# Patient Record
Sex: Female | Born: 1985
Health system: Southern US, Community
[De-identification: ages and names within clinical notes are randomized; demographics above are authoritative.]

---

## 2013-11-10 ENCOUNTER — Encounter (HOSPITAL_COMMUNITY): Payer: Self-pay | Admitting: Emergency Medicine

## 2013-11-10 ENCOUNTER — Emergency Department (HOSPITAL_COMMUNITY)
Admission: EM | Admit: 2013-11-10 | Discharge: 2013-11-10 | Disposition: A | Discharge status: Home / Self Care | Payer: Worker's Compensation | Attending: Emergency Medicine | Admitting: Emergency Medicine

## 2013-11-10 ENCOUNTER — Emergency Department (HOSPITAL_COMMUNITY): Payer: Worker's Compensation

## 2013-11-10 DIAGNOSIS — S93409A Sprain of unspecified ligament of unspecified ankle, initial encounter: Secondary | ICD-10-CM | POA: Insufficient documentation

## 2013-11-10 DIAGNOSIS — Z79899 Other long term (current) drug therapy: Secondary | ICD-10-CM | POA: Insufficient documentation

## 2013-11-10 DIAGNOSIS — IMO0002 Reserved for concepts with insufficient information to code with codable children: Secondary | ICD-10-CM | POA: Insufficient documentation

## 2013-11-10 DIAGNOSIS — Y929 Unspecified place or not applicable: Secondary | ICD-10-CM | POA: Insufficient documentation

## 2013-11-10 DIAGNOSIS — R296 Repeated falls: Secondary | ICD-10-CM | POA: Insufficient documentation

## 2013-11-10 DIAGNOSIS — F17200 Nicotine dependence, unspecified, uncomplicated: Secondary | ICD-10-CM | POA: Insufficient documentation

## 2013-11-10 DIAGNOSIS — Y9389 Activity, other specified: Secondary | ICD-10-CM | POA: Insufficient documentation

## 2013-11-10 DIAGNOSIS — Y990 Civilian activity done for income or pay: Secondary | ICD-10-CM | POA: Insufficient documentation

## 2013-11-10 DIAGNOSIS — X500XXA Overexertion from strenuous movement or load, initial encounter: Secondary | ICD-10-CM | POA: Insufficient documentation

## 2013-11-10 DIAGNOSIS — Z880 Allergy status to penicillin: Secondary | ICD-10-CM | POA: Insufficient documentation

## 2013-11-10 MED ORDER — OXYCODONE-ACETAMINOPHEN 5-325 MG PO TABS: 1.0000 | ORAL_TABLET | Freq: Four times a day (QID) | ORAL | Status: AC | PRN

## 2013-11-10 MED ORDER — OXYCODONE-ACETAMINOPHEN 5-325 MG PO TABS
2.0000 | ORAL_TABLET | Freq: Once | ORAL | Status: AC
  Administered 2013-11-10: 2 via ORAL
  Filled 2013-11-10: qty 2

## 2013-11-10 NOTE — Discharge Instructions (Signed)
Acute Ankle Sprain  with Phase I Rehab  An acute ankle sprain is a partial or complete tear in one or more of the ligaments of the ankle due to traumatic injury. The severity of the injury depends on both the the number of ligaments sprained and the grade of sprain. There are 3 grades of sprains.   · A grade 1 sprain is a mild sprain. There is a slight pull without obvious tearing. There is no loss of strength, and the muscle and ligament are the correct length.  · A grade 2 sprain is a moderate sprain. There is tearing of fibers within the substance of the ligament where it connects two bones or two cartilages. The length of the ligament is increased, and there is usually decreased strength.  · A grade 3 sprain is a complete rupture of the ligament and is uncommon.  In addition to the grade of sprain, there are three types of ankle sprains.   Lateral ankle sprains: This is a sprain of one or more of the three ligaments on the outer side (lateral) of the ankle. These are the most common sprains.  Medial ankle sprains: There is one large triangular ligament of the inner side (medial) of the ankle that is susceptible to injury. Medial ankle sprains are less common.  Syndesmosis, "high ankle," sprains: The syndesmosis is the ligament that connects the two bones of the lower leg. Syndesmosis sprains usually only occur with very severe ankle sprains.  SYMPTOMS  · Pain, tenderness, and swelling in the ankle, starting at the side of injury that may progress to the whole ankle and foot with time.  · "Pop" or tearing sensation at the time of injury.  · Bruising that may spread to the heel.  · Impaired ability to walk soon after injury.  CAUSES   · Acute ankle sprains are caused by trauma placed on the ankle that temporarily forces or pries the anklebone (talus) out of its normal socket.  · Stretching or tearing of the ligaments that normally hold the joint in place (usually due to a twisting injury).  RISK INCREASES  WITH:  · Previous ankle sprain.  · Sports in which the foot may land awkwardly (ie. basketball, volleyball, or soccer) or walking or running on uneven or rough surfaces.  · Shoes with inadequate support to prevent sideways motion when stress occurs.  · Poor strength and flexibility.  · Poor balance skills.  · Contact sports.  PREVENTION   · Warm up and stretch properly before activity.  · Maintain physical fitness:  · Ankle and leg flexibility, muscle strength, and endurance.  · Cardiovascular fitness.  · Balance training activities.  · Use proper technique and have a coach correct improper technique.  · Taping, protective strapping, bracing, or high-top tennis shoes may help prevent injury. Initially, tape is best; however, it loses most of its support function within 10 to 15 minutes.  · Wear proper fitted protective shoes (High-top shoes with taping or bracing is more effective than either alone).  · Provide the ankle with support during sports and practice activities for 12 months following injury.  PROGNOSIS   · If treated properly, ankle sprains can be expected to recover completely; however, the length of recovery depends on the degree of injury.  · A grade 1 sprain usually heals enough in 5 to 7 days to allow modified activity and requires an average of 6 weeks to heal completely.  · A grade 2 sprain requires   symptoms may result in a chronic problem. Appropriately addressing the problem the first time decreases the frequency of recurrence and optimizes healing time. Severity of the initial sprain does not predict the likelihood of later instability.  Injury to other structures (bone, cartilage, or tendon).  A chronically unstable or arthritic ankle joint is a possiblity with repeated  sprains. TREATMENT Treatment initially involves the use of ice, medication, and compression bandages to help reduce pain and inflammation. Ankle sprains are usually immobilized in a walking cast or boot to allow for healing. Crutches may be recommended to reduce pressure on the injury. After immobilization, strengthening and stretching exercises may be necessary to regain strength and a full range of motion. Surgery is rarely needed to treat ankle sprains. MEDICATION   Nonsteroidal anti-inflammatory medications, such as aspirin and ibuprofen (do not take for the first 3 days after injury or within 7 days before surgery), or other minor pain relievers, such as acetaminophen, are often recommended. Take these as directed by your caregiver. Contact your caregiver immediately if any bleeding, stomach upset, or signs of an allergic reaction occur from these medications.  Ointments applied to the skin may be helpful.  Pain relievers may be prescribed as necessary by your caregiver. Do not take prescription pain medication for longer than 4 to 7 days. Use only as directed and only as much as you need. HEAT AND COLD  Cold treatment (icing) is used to relieve pain and reduce inflammation for acute and chronic cases. Cold should be applied for 10 to 15 minutes every 2 to 3 hours for inflammation and pain and immediately after any activity that aggravates your symptoms. Use ice packs or an ice massage.  Heat treatment may be used before performing stretching and strengthening activities prescribed by your caregiver. Use a heat pack or a warm soak. SEEK IMMEDIATE MEDICAL CARE IF:   Pain, swelling, or bruising worsens despite treatment.  You experience pain, numbness, discoloration, or coldness in the foot or toes.  New, unexplained symptoms develop (drugs used in treatment may produce side effects.) EXERCISES  PHASE I EXERCISES RANGE OF MOTION (ROM) AND STRETCHING EXERCISES - Ankle Sprain, Acute Phase I,  Weeks 1 to 2 These exercises may help you when beginning to restore flexibility in your ankle. You will likely work on these exercises for the 1 to 2 weeks after your injury. Once your physician, physical therapist, or athletic trainer sees adequate progress, he or she will advance your exercises. While completing these exercises, remember:   Restoring tissue flexibility helps normal motion to return to the joints. This allows healthier, less painful movement and activity.  An effective stretch should be held for at least 30 seconds.  A stretch should never be painful. You should only feel a gentle lengthening or release in the stretched tissue. RANGE OF MOTION - Dorsi/Plantar Flexion  While sitting with your right / left knee straight, draw the top of your foot upwards by flexing your ankle. Then reverse the motion, pointing your toes downward.  Hold each position for __________ seconds.  After completing your first set of exercises, repeat this exercise with your knee bent. Repeat __________ times. Complete this exercise __________ times per day.  RANGE OF MOTION - Ankle Alphabet  Imagine your right / left big toe is a pen.  Keeping your hip and knee still, write out the entire alphabet with your "pen." Make the letters as large as you can without increasing any discomfort. Repeat __________ times. Complete this  exercise __________ times per day.  STRENGTHENING EXERCISES - Ankle Sprain, Acute -Phase I, Weeks 1 to 2 These exercises may help you when beginning to restore strength in your ankle. You will likely work on these exercises for 1 to 2 weeks after your injury. Once your physician, physical therapist, or athletic trainer sees adequate progress, he or she will advance your exercises. While completing these exercises, remember:   Muscles can gain both the endurance and the strength needed for everyday activities through controlled exercises.  Complete these exercises as instructed by  your physician, physical therapist, or athletic trainer. Progress the resistance and repetitions only as guided.  You may experience muscle soreness or fatigue, but the pain or discomfort you are trying to eliminate should never worsen during these exercises. If this pain does worsen, stop and make certain you are following the directions exactly. If the pain is still present after adjustments, discontinue the exercise until you can discuss the trouble with your clinician. STRENGTH - Dorsiflexors  Secure a rubber exercise band/tubing to a fixed object (ie. table, pole) and loop the other end around your right / left foot.  Sit on the floor facing the fixed object. The band/tubing should be slightly tense when your foot is relaxed.  Slowly draw your foot back toward you using your ankle and toes.  Hold this position for __________ seconds. Slowly release the tension in the band and return your foot to the starting position. Repeat __________ times. Complete this exercise __________ times per day.  STRENGTH - Plantar-flexors   Sit with your right / left leg extended. Holding onto both ends of a rubber exercise band/tubing, loop it around the ball of your foot. Keep a slight tension in the band.  Slowly push your toes away from you, pointing them downward.  Hold this position for __________ seconds. Return slowly, controlling the tension in the band/tubing. Repeat __________ times. Complete this exercise __________ times per day.  STRENGTH - Ankle Eversion  Secure one end of a rubber exercise band/tubing to a fixed object (table, pole). Loop the other end around your foot just before your toes.  Place your fists between your knees. This will focus your strengthening at your ankle.  Drawing the band/tubing across your opposite foot, slowly, pull your little toe out and up. Make sure the band/tubing is positioned to resist the entire motion.  Hold this position for __________ seconds. Have  your muscles resist the band/tubing as it slowly pulls your foot back to the starting position.  Repeat __________ times. Complete this exercise __________ times per day.  STRENGTH - Ankle Inversion  Secure one end of a rubber exercise band/tubing to a fixed object (table, pole). Loop the other end around your foot just before your toes.  Place your fists between your knees. This will focus your strengthening at your ankle.  Slowly, pull your big toe up and in, making sure the band/tubing is positioned to resist the entire motion.  Hold this position for __________ seconds.  Have your muscles resist the band/tubing as it slowly pulls your foot back to the starting position. Repeat __________ times. Complete this exercises __________ times per day.  STRENGTH - Towel Curls  Sit in a chair positioned on a non-carpeted surface.  Place your right / left foot on a towel, keeping your heel on the floor.  Pull the towel toward your heel by only curling your toes. Keep your heel on the floor.  If instructed by your physician,  physical therapist, or athletic trainer, add weight to the end of the towel. Repeat __________ times. Complete this exercise __________ times per day. Document Released: 02/25/2005 Document Revised: 10/19/2011 Document Reviewed: 11/08/2008 [facility] PaExit[facility] Patient Information 2014 [place]Care, [place]. Start the exercises in 1 week

## 2013-11-10 NOTE — ED Notes (Signed)
Pt states she was at work carrying a large box, she fell into an uncovered water drain in the parking lot and twisted her ankle.

## 2013-11-10 NOTE — ED Provider Notes (Signed)
CSN: [id number]     Arrival date & time 11/10/13  2010 History  This chart was scribed for non-physician practitioner, Arman FilterGail K Keashia Haskins, NP, working with Shanna CiscoMegan E Docherty, MD by Charline BillsEssence Howell, ED Scribe. This patient was seen in room WTR7/WTR7 and the patient's care was started at 8:47 PM.   Chief Complaint  Patient presents with  . Ankle Pain    The history is provided by the patient. No language interpreter was used.   HPI Comments: Sonia HousemanChelsea Tiernan is a 28 y.o. female who presents to the Emergency Department complaining of L ankle injury onset 1 hr ago. Pt states that she was carrying a large box at work when she fell into an uncovered drain and twisted her ankle. She describes the pain in her ankle as tingling. She denies any other pain.  History reviewed. No pertinent past medical history. History reviewed. No pertinent past surgical history. History reviewed. No pertinent family history. History  Substance Use Topics  . Smoking status: Current Every Day Smoker    Types: Cigarettes  . Smokeless tobacco: Not on file  . Alcohol Use: No   OB History   Grav Para Term Preterm Abortions TAB SAB Ect Mult Living                 Review of Systems  Musculoskeletal: Positive for arthralgias.     Allergies  Penicillins  Home Medications   Current Outpatient Rx  Name  Route  Sig  Dispense  Refill  . fluticasone (FLONASE) 50 MCG/ACT nasal spray   Each Nare   Place 1 spray into both nostrils daily.         Marland Kitchen. loratadine-pseudoephedrine (CLARITIN-D 24-HOUR) 10-240 MG per 24 hr tablet   Oral   Take 1 tablet by mouth daily.         Marland Kitchen. oxyCODONE-acetaminophen (PERCOCET/ROXICET) 5-325 MG per tablet   Oral   Take 1 tablet by mouth every 6 (six) hours as needed for severe pain.   14 tablet   0    Triage Vitals: Pulse 88  Temp(Src) 98.2 F (36.8 C)  Resp 18  SpO2 98% Physical Exam  ED Course  Procedures (including critical care time) DIAGNOSTIC STUDIES: Oxygen Saturation  is 98% on RA, normal by my interpretation.    COORDINATION OF CARE: 8:49 PM-Discussed treatment plan which includes L ankle XR with pt at bedside and pt agreed to plan.   Labs Review Labs Reviewed - No data to display Imaging Review Dg Ankle Complete Left  11/10/2013   CLINICAL DATA:  Left ankle pain post twisted ankle  EXAM: LEFT ANKLE COMPLETE - 3+ VIEW  COMPARISON:  None.  FINDINGS: Three views of left ankle submitted. No acute fracture or subluxation. Ankle mortise is preserved. Soft tissue swelling adjacent to lateral malleolus.  IMPRESSION: No acute fracture or subluxation.  Lateral soft tissue swelling.   Electronically Signed   By: Natasha MeadLiviu  Pop M.D.   On: 11/10/2013 20:40     EKG Interpretation None      MDM  Patient takes reviewed.  No fracture noted.  She's been placed in an ASO, taken out of work until Tuesday, Percocet for discomfort.  Patient is to keep foot elevated as much as possible over the next several days with ice applied.  She's been given referral to orthopedics for followup Final diagnoses:  Ankle sprain      I personally performed the services described in this documentation, which was scribed in my presence.  The recorded information has been reviewed and is accurate.     Arman Filter, NP 11/10/13 2134

## 2013-11-11 NOTE — ED Provider Notes (Signed)
Medical screening examination/treatment/procedure(s) were performed by non-physician practitioner and as supervising physician I was immediately available for consultation/collaboration.   Megan E Docherty, MD 11/11/13 1038 

## 2015-02-23 ENCOUNTER — Encounter (HOSPITAL_COMMUNITY): Payer: Self-pay | Admitting: Emergency Medicine

## 2015-02-23 ENCOUNTER — Emergency Department (HOSPITAL_COMMUNITY)
Admission: EM | Admit: 2015-02-23 | Discharge: 2015-02-23 | Disposition: A | Discharge status: Home / Self Care | Payer: 59 | Source: Home / Self Care | Attending: Emergency Medicine | Admitting: Emergency Medicine

## 2015-02-23 DIAGNOSIS — S61219A Laceration without foreign body of unspecified finger without damage to nail, initial encounter: Secondary | ICD-10-CM

## 2015-02-23 NOTE — ED Notes (Signed)
Pt reports she cut her left index finger around 1750 while cutting a cake Bleeding controlled... Last tetanus >1 year  Alert, no signs of acute distress.

## 2015-02-23 NOTE — Discharge Instructions (Signed)
Laceration Care, Adult A laceration is a cut or lesion that goes through all layers of the skin and into the tissue just beneath the skin. TREATMENT  Some lacerations may not require closure. Some lacerations may not be able to be closed due to an increased risk of infection. It is important to see your caregiver as soon as possible after an injury to minimize the risk of infection and maximize the opportunity for successful closure. If closure is appropriate, pain medicines may be given, if needed. The wound will be cleaned to help prevent infection. Your caregiver will use stitches (sutures), staples, wound glue (adhesive), or skin adhesive strips to repair the laceration. These tools bring the skin edges together to allow for faster healing and a better cosmetic outcome. However, all wounds will heal with a scar. Once the wound has healed, scarring can be minimized by covering the wound with sunscreen during the day for 1 full year. HOME CARE INSTRUCTIONS  For skin adhesive strips:  Keep the wound clean and dry.  Do not get the skin adhesive strips wet. You may bathe carefully, using caution to keep the wound dry.  If the wound gets wet, pat it dry with a clean towel.  Skin adhesive strips will fall off on their own. You may trim the strips as the wound heals. Do not remove skin adhesive strips that are still stuck to the wound. They will fall off in time. You may need a tetanus shot if:  You cannot remember when you had your last tetanus shot.  You have never had a tetanus shot. If you get a tetanus shot, your arm may swell, get red, and feel warm to the touch. This is common and not a problem. If you need a tetanus shot and you choose not to have one, there is a rare chance of getting tetanus. Sickness from tetanus can be serious. SEEK MEDICAL CARE IF:   You have redness, swelling, or increasing pain in the wound.  You see a red line that goes away from the wound.  You have  yellowish-white fluid (pus) coming from the wound.  You have a fever.  You notice a bad smell coming from the wound or dressing.  Your wound breaks open before or after sutures have been removed.  You notice something coming out of the wound such as wood or glass.  Your wound is on your hand or foot and you cannot move a finger or toe. SEEK IMMEDIATE MEDICAL CARE IF:   Your pain is not controlled with prescribed medicine.  You have severe swelling around the wound causing pain and numbness or a change in color in your arm, hand, leg, or foot.  Your wound splits open and starts bleeding.  You have worsening numbness, weakness, or loss of function of any joint around or beyond the wound.  You develop painful lumps near the wound or on the skin anywhere on your body. MAKE SURE YOU:   Understand these instructions.  Will watch your condition.  Will get help right away if you are not doing well or get worse. Document Released: 07/27/2005 Document Revised: 10/19/2011 Document Reviewed: 01/20/2011 [facility] WorthExitCare Patient Information 2015 [place]ExitCare, [place]LL[place]. This information is not intended to replace advice given to you by your health care provider. Make sure you discuss any questions you have with your health care provider.  Keep the brace on for the next 3 days.

## 2015-02-23 NOTE — ED Provider Notes (Signed)
CSN: [id number]     Arrival date & time 02/23/15  1843 History   First MD Initiated Contact with Patient 02/23/15 1913     Chief Complaint  Patient presents with  . Extremity Laceration   (Consider location/radiation/quality/duration/timing/severity/associated sxs/prior Treatment) HPI  She is a 29 year old woman here for evaluation of left index finger laceration. She states she was using a wedding cake knife to cut some flowers and it cut her index finger. She washed it and came here. Last tetanus was a year and a half ago.  She can fully extend and flex her finger.  History reviewed. No pertinent past medical history. History reviewed. No pertinent past surgical history. No family history on file. History  Substance Use Topics  . Smoking status: Current Every Day Smoker    Types: Cigarettes  . Smokeless tobacco: Not on file  . Alcohol Use: No   OB History    No data available     Review of Systems As in history of present illness Allergies  Penicillins  Home Medications   Prior to Admission medications   Medication Sig Start Date End Date Taking? Authorizing Provider  fluticasone (FLONASE) 50 MCG/ACT nasal spray Place 1 spray into both nostrils daily.    Historical Provider, MD  loratadine-pseudoephedrine (CLARITIN-D 24-HOUR) 10-240 MG per 24 hr tablet Take 1 tablet by mouth daily.    Historical Provider, MD  oxyCODONE-acetaminophen (PERCOCET/ROXICET) 5-325 MG per tablet Take 1 tablet by mouth every 6 (six) hours as needed for severe pain. 11/10/13   Earley FavorGail Schulz, NP   BP 119/79 mmHg  Pulse 63  Temp(Src) 97 F (36.1 C) (Oral)  Resp 20  SpO2 96%  LMP 01/29/2015 Physical Exam  Constitutional: She is oriented to person, place, and time. She appears well-developed and well-nourished. No distress.  Cardiovascular: Normal rate.   Pulmonary/Chest: Effort normal.  Neurological: She is alert and oriented to person, place, and time.  Skin:  Left index finger: She has a flap  type laceration over the middle phalanx. She can fully extend and flex her finger without difficulty. Extensor tendon is visualized, but does not have any laceration.    ED Course  LACERATION REPAIR Date/Time: 02/23/2015 7:47 PM Performed by: Charm RingsHONIG, Rashmi Tallent J Authorized by: Charm RingsHONIG, Savier Trickett J Consent: Verbal consent obtained. Risks and benefits: risks, benefits and alternatives were discussed Consent given by: patient Patient understanding: patient states understanding of the procedure being performed Patient identity confirmed: verbally with patient Time out: Immediately prior to procedure a "time out" was called to verify the correct patient, procedure, equipment, support staff and site/side marked as required. Body area: upper extremity Location details: left index finger Laceration length: 2 cm Irrigation solution: tap water Irrigation method: syringe Amount of cleaning: standard Wound skin closure material used: steri strips. Approximation: close Approximation difficulty: simple Dressing: antibiotic ointment and 4x4 sterile gauze Patient tolerance: Patient tolerated the procedure well with no immediate complications Comments: Finger brace applied.   (including critical care time) Labs Review Labs Reviewed - No data to display  Imaging Review No results found.   MDM   1. Finger laceration, initial encounter    Wound care as in after visit summary. Follow-up as needed.    Charm RingsErin J Erine Phenix, MD 02/23/15 [phone]

## 2015-11-12 IMAGING — CR DG ANKLE COMPLETE 3+V*L*
3 series · 3 of 3 positions shown · non-contrast
Comparison: None.

CLINICAL DATA: Left ankle pain post twisted ankle

EXAM:
LEFT ANKLE COMPLETE - 3+ VIEW

[x ankle ap left]
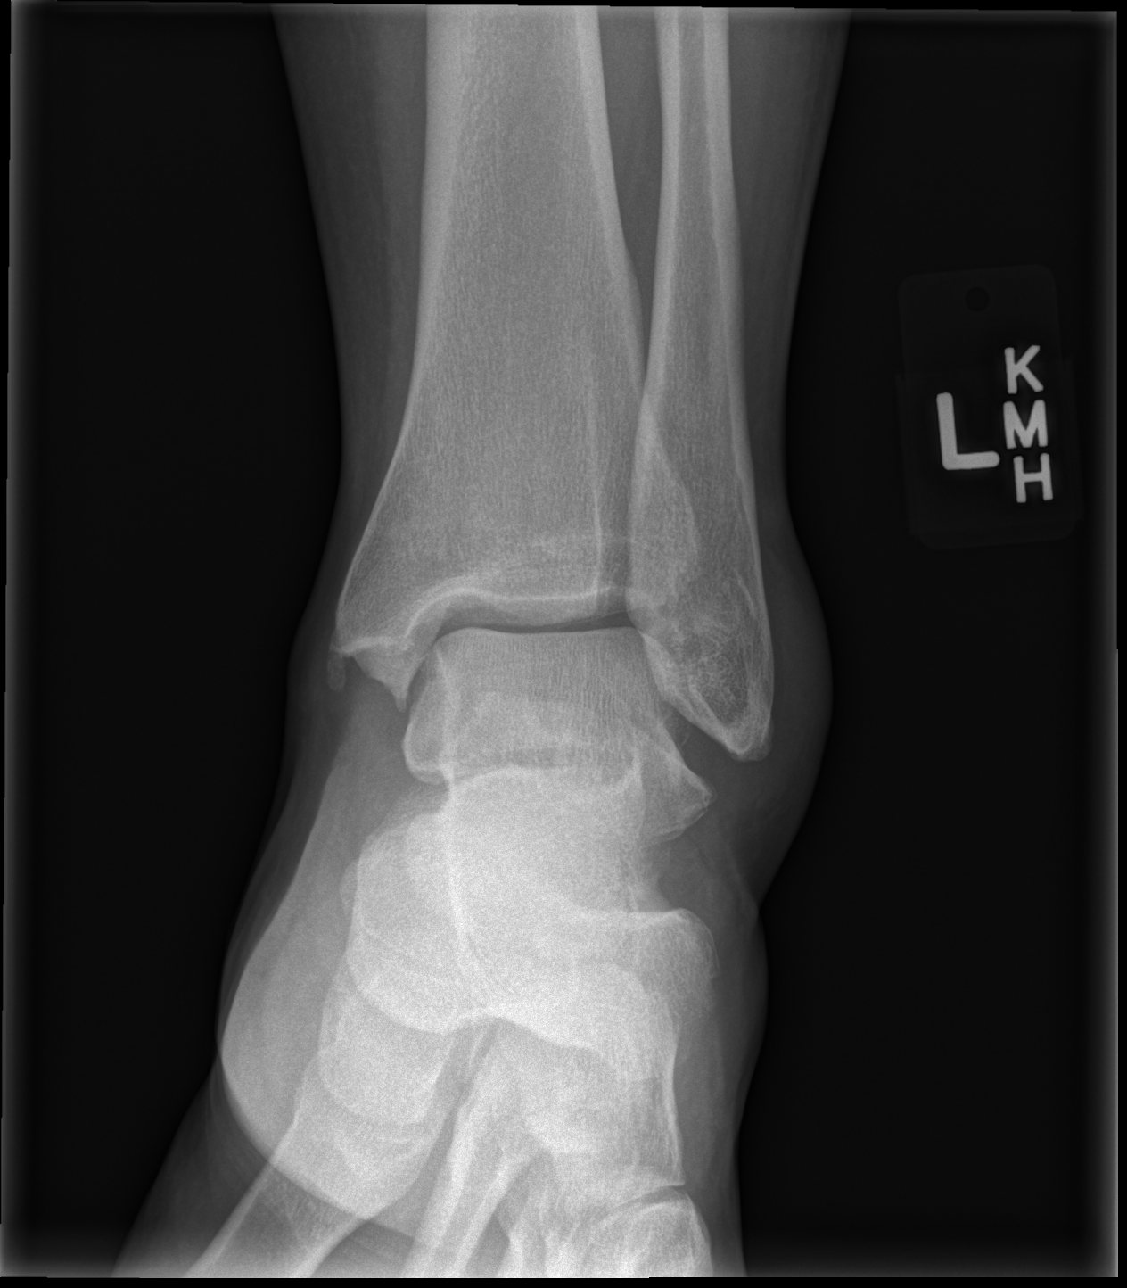

[x ankle obl left]
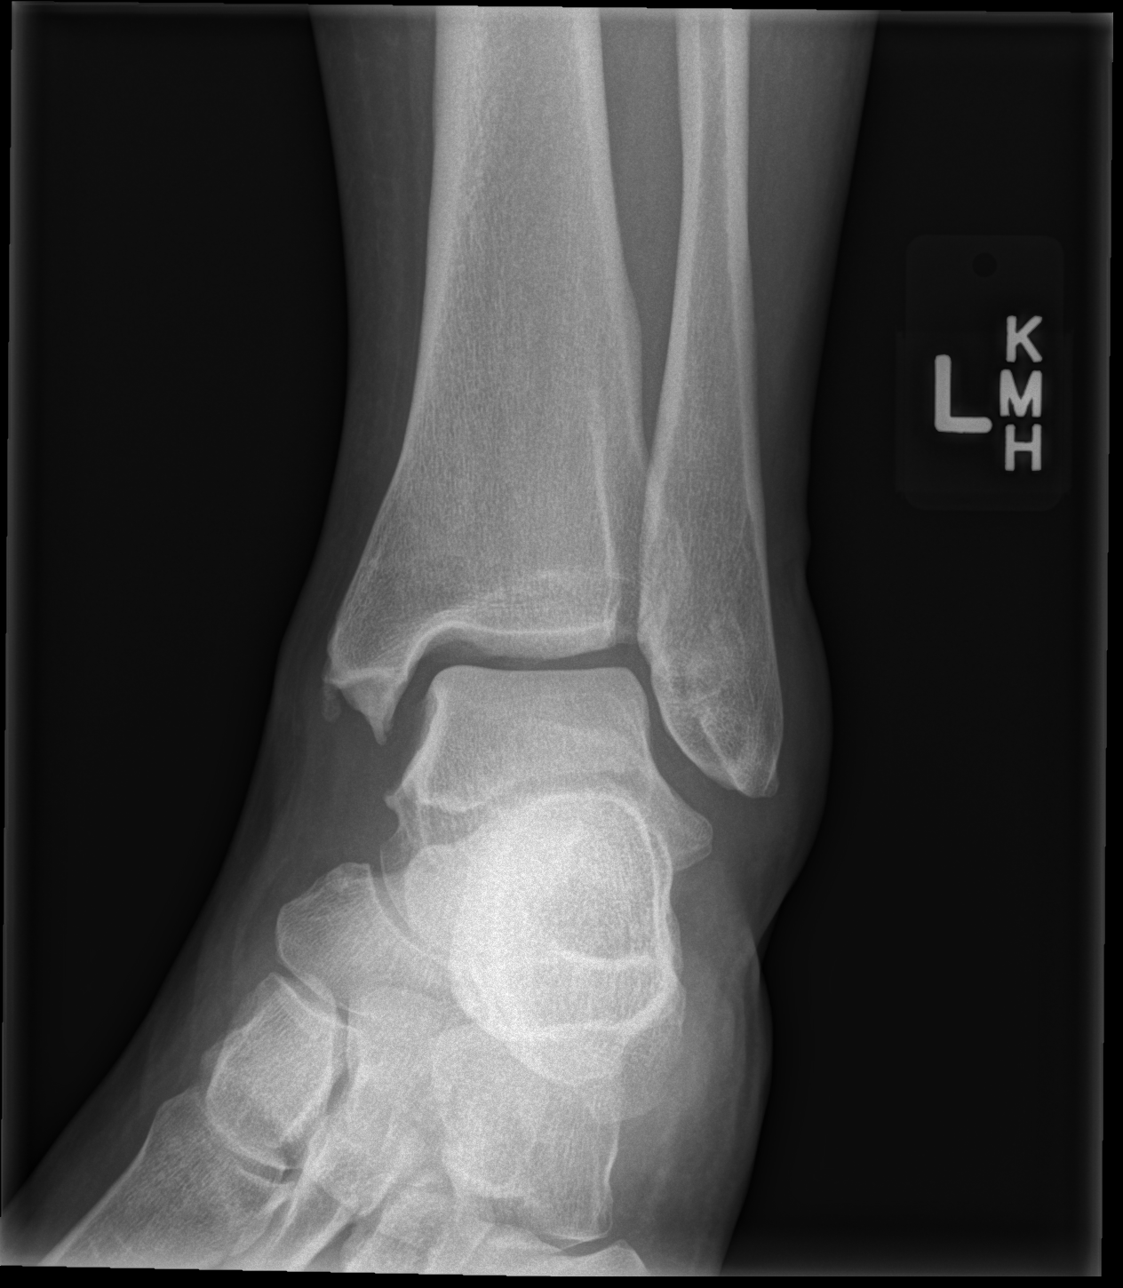

[x ankle lat left]
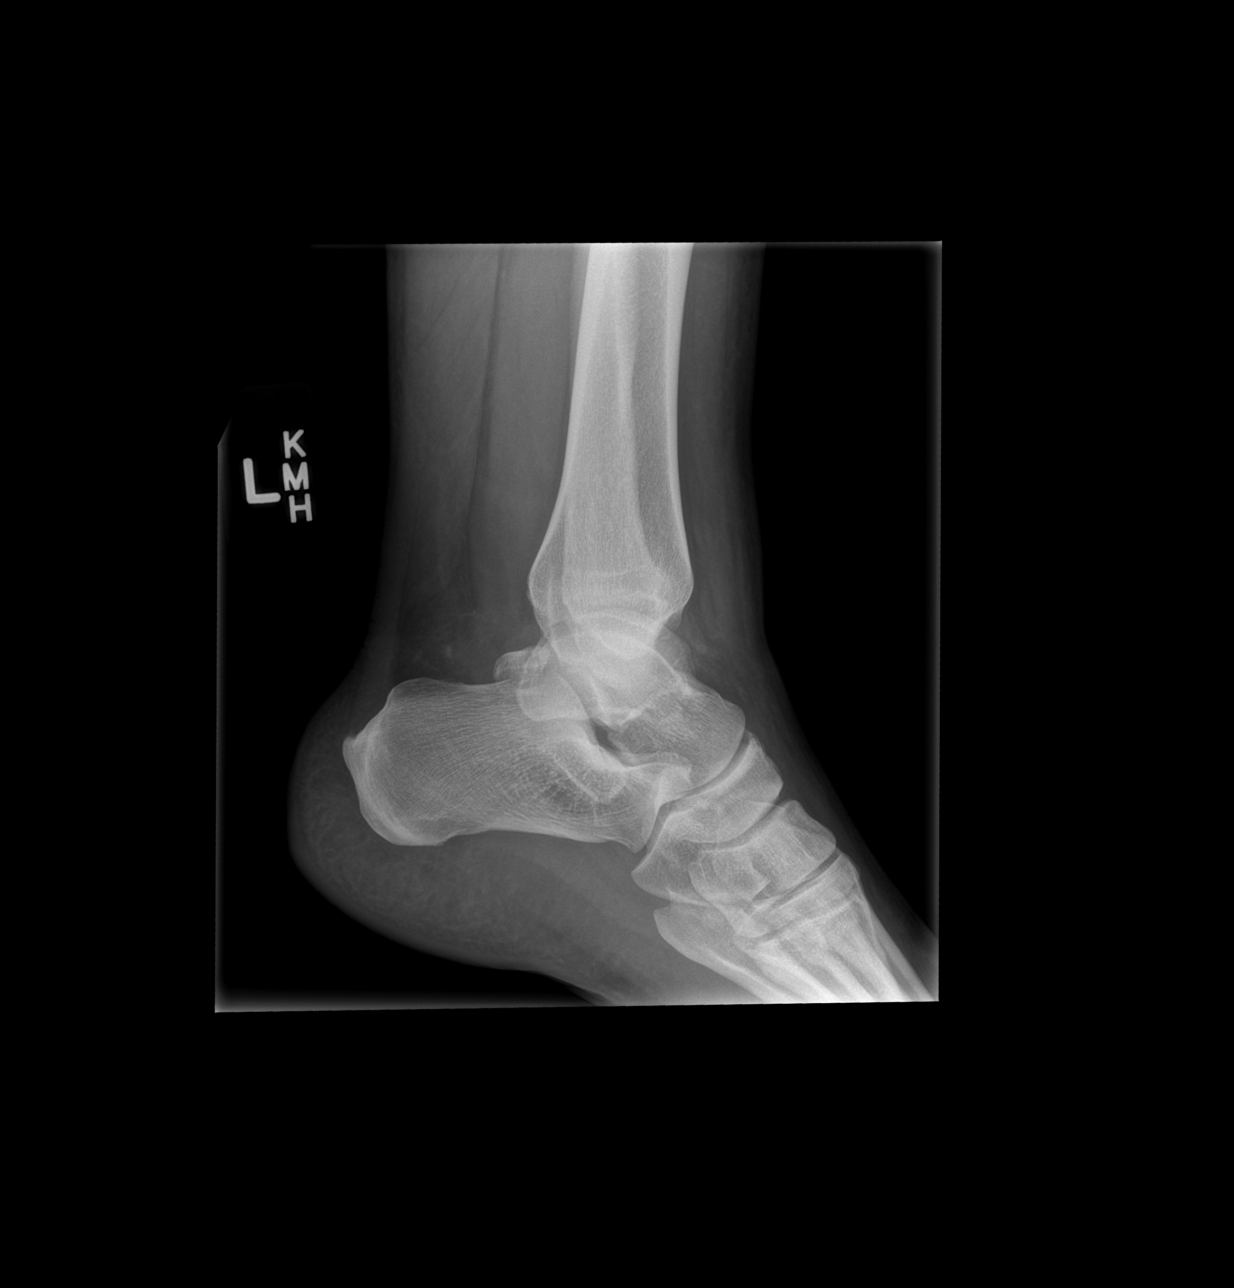

[3 of 3 positions shown; findings below may reference images not displayed]

FINDINGS: Three views of left ankle submitted. No acute fracture or
subluxation. Ankle mortise is preserved. Soft tissue swelling
adjacent to lateral malleolus.
IMPRESSION: No acute fracture or subluxation.  Lateral soft tissue swelling.

## 2024-01-24 NOTE — Progress Notes (Signed)
 Otolaryngology Office Visit Note  Seen at the kind request of No ref. provider found for evaluation of: Throat  Assessment and Plan Problem List Items Addressed This Visit   None Visit Diagnoses       Peritonsillar abscess    -  Primary      Surgery to Schedule  HPI Chief Complaint  Patient presents with  . Enlarged Tonsils    Tonsil abscess, trouble swallowing, pain, ear pain    Allergies Allergies[1]  Current Meds Current Medications[2]  Active Problems Problem List[3]  PSH Surgical History[4]  Family Hx Family History[5]  Social Hx Social History   Socioeconomic History  . Marital status: Single    Spouse name: Not on file  . Number of children: Not on file  . Years of education: Not on file  . Highest education level: Not on file  Occupational History  . Not on file  Tobacco Use  . Smoking status: Former    Current packs/day: 0.00    Types: Cigarettes    Quit date: 06/01/2014    Years since quitting: 9.6  . Smokeless tobacco: Never  . Tobacco comments:    stopped 05/30/2014  Substance and Sexual Activity  . Alcohol use: No    Comment: socially  . Drug use: No  . Sexual activity: Not on file  Other Topics Concern  . Not on file  Social History Narrative  . Not on file   Social Drivers of Health   Food Insecurity: Low Risk  (11/17/2023)   Food vital sign   . Within the past 12 months, you worried that your food would run out before you got money to buy more: Never true   . Within the past 12 months, the food you bought just didn't last and you didn't have money to get more: Never true  Transportation Needs: Unmet Transportation Needs (11/17/2023)   Transportation   . In the past 12 months, has lack of reliable transportation kept you from medical appointments, meetings, work or from getting things needed for daily living? : Yes  Safety: Low Risk  (01/11/2024)   Safety   . How often does anyone, including family and friends, physically hurt  you?: Never   . How often does anyone, including family and friends, insult or talk down to you?: Never   . How often does anyone, including family and friends, threaten you with harm?: Never   . How often does anyone, including family and friends, scream or curse at you?: Never  Living Situation: Low Risk  (11/17/2023)   Living Situation   . What is your living situation today?: I have a steady place to live   . Think about the place you live. Do you have problems with any of the following? Choose all that apply:: None/None on this list    Vital Signs Vitals:   01/24/24 0814  BP: 106/74  Pulse: 80    Physical Exam   EXAMINATION  Constitutional General Appearance: Well appearing in no acute distress Ability to Communicate: Age appropriate Pain: None at this time  Head & Face Inspection:  Normocephalic  Eyes Ocular Motility: Extraocular muscles intact   Ears Right EXT: Normal Right EAC: Normal Right TM: Normal Tuning Fork:  Left EXT: Normal Left ZJR:Wnmfjo Left TM: Normal Tuning Fork:      Nose Ext Nose: Dorsum midline Intranasal: No polyps or purulence  Oral Cavity, Mouth  Pharynx, Larynx See Assessment and Plan  Cervical Neck: No palpable masses  Thyroid: No palpable masses  Lymphatic Neck Nodes: No palpable nodes   Respiratory/Cardiac      Neuro/Psych Cranial Nerves:  Orientation: Age appropriate Mood & Affect: Age appropriate  GI/GU   Skin No facial lesions  Musculoskeletal   Extremities    PROCEDURE: Incision and drainage  Date/Time: 01/24/2024 8:40 AM  Performed by: Alm JULIANNA Georgina Mickey., MD Authorized by: Alm JULIANNA Georgina Mickey., MD   Location:    Type:  Abscess   Location:  Mouth   Mouth location:  Peritonsillar (Right) Procedure details:    Needle aspiration: yes     Needle size:  18 G   Incision types:  Single straight   Incision depth:  Submucosal   Wound management:  Probed and deloculated   Drainage:  Purulent   Drainage amount:  Moderate   Wound  treatment:  Wound left open Post-procedure details:    Procedure completion:  Tolerated   Alm Georgina, MD [phone]      [1] Allergies Allergen Reactions  . Latex Anaphylaxis  . Penicillin G Anaphylaxis  . Adhesive Other (See Comments)     RED WELPS, AND  Peels skin off, paper tape does work best  . Celecoxib GI Intolerance  [2]  Current Outpatient Medications:  .  acetaminophen  (TYLENOL ) 500 mg tablet, Take 1,500 mg by mouth every 6 (six) hours as needed., Disp: , Rfl:  .  ascorbic acid (VITAMIN C) 500 mg tablet, Take 500 mg by mouth daily., Disp: , Rfl:  .  cholecalciferol (VITAMIN D3) 1,250 mcg (50,000 unit) capsule, Take 1 each (50,000 Units total) by mouth daily., Disp: 90 capsule, Rfl: 1 .  clindamycin (CLEOCIN) 300 mg capsule, Take 2 capsules (600 mg total) by mouth 3 (three) times a day for 10 days., Disp: , Rfl:  .  ferrous sulfate 325 mg (65 mg iron) tablet, Take 325 mg by mouth in the morning and 325 mg at noon and 325 mg in the evening. Take before meals., Disp: , Rfl:  .  magnesium glycinate (Mag Glycinate) 100 mg tab, Take 3 tablets (300 mg total) by mouth daily., Disp: , Rfl:  .  multivitamin-min-iron-folic acid-vit K (Bariatric Multivitamins) 45 mg iron- 800 mcg-120 mcg cap, 2 (two) times a day., Disp: , Rfl:  .  ondansetron (ZOFRAN-ODT) 8 mg disintegrating tablet, Take 8 mg by mouth every 12 (twelve) hours as needed for nausea., Disp: 30 tablet, Rfl: 0 .  vit A palmitate-beta carotene 25,000 unit (15K-10K unit) tab, Take 1 tablet by mouth daily., Disp: , Rfl:  .  gabapentin (NEURONTIN) 400 mg capsule, Take 800 mg by mouth as needed. (Patient not taking: Reported on 11/30/2023), Disp: , Rfl:  [3] Patient Active Problem List Diagnosis  . Morbid obesity (HCC)  . Gastroesophageal reflux disease without esophagitis  . History of Roux-en-Y gastric bypass  . H/O bariatric surgery  . Mixed hyperlipidemia  . Essential hypertension  . SSS (sick sinus syndrome) (HCC)   . Pacemaker reprogramming/check  . Adjustment insomnia  . Vitamin D deficiency  . Tonsillitis  . Acute non-recurrent sinusitis  . Bile-induced gastritis  . Other fatigue  . Biliary sludge  . Chronic cholecystitis  . Postoperative examination  . Close exposure to COVID-19 virus  . Swelling of both lower extremities  . Swelling of left upper extremity  . Suspected COVID-19 virus infection  . Iron deficiency anemia  . Vitamin A deficiency  . Dehydration  . Bilateral hip pain  . Malabsorption syndrome (CMD)  .  Intramural and submucous leiomyoma of uterus  . Menorrhagia with regular cycle  . H/O total hysterectomy  . Postoperative fever  . Dysuria  . Abscess of tonsil  . Uvular edema  [4] Past Surgical History: Procedure Laterality Date  . BELT ABDOMINOPLASTY     Procedure: ABDOMINOPLASTY  . BRACHIOPLASTY     Procedure: BRACHIOPLASTY  . BREAST AUGMENTATION Bilateral    Procedure: AUGMENTATION MAMMAPLASTY  . CARDIAC CATHETERIZATION     Procedure: CARDIAC CATHETERIZATION  . DUODENAL SWITCH N/A 07/12/2017   Procedure: DUODENAL SWITCH LAPAROSCOPIC;  Surgeon: Wilfred Enzo Guan, MD;  Location: [facility] MAIN OR;  Service: General;  Laterality: N/A;  . ESOPHAGOGASTRODUODENOSCOPY     Procedure: ESOPHAGOGASTRODUODENOSCOPY  . INSERT / REPLACE / REMOVE PACEMAKER     Procedure: INSERT / REPLACE / REMOVE PACEMAKER  . LAPAROSCOPIC CHOLECYSTECTOMY N/A 2020   Procedure: LAPAROSCOPIC CHOLECYSTECTOMY  . LAPAROSCOPIC HYSTERECTOMY    . PILONIDAL CYST DRAINAGE  2006   Procedure: PILONIDAL CYST DRAINAGE  [5] Family History Problem Relation Name Age of Onset  . Heart disease Father         MI x 5, earliest MI at 87  . Hypertension Father    . Diabetes Father    . Kidney failure Paternal Grandmother    . Heart disease Paternal Grandmother    . Diabetes Paternal Grandmother    . Diabetes Paternal Aunt    . Diabetes Paternal Grandfather    . Breast cancer Neg Hx    . Colon cancer Neg  Hx

## 2024-01-26 NOTE — Progress Notes (Signed)
 Otolaryngology Patient Follow Up Note  Subjective: Ms. Sonia Holt is a 38 y.o. female who presented on 01/19/2024 with a right peritonsillar abscess.  This was treated with aspiration and clindamycin.  The patient Sonia Holt presented 5 days later with worsening pain and trismus.  Recurrence of the abscess was noted on exam and the patient was treated with formal I&D.    She returns today reporting persistent right-sided throat pain and trismus.  Symptoms are not significantly improved from previous.  No difficulty breathing or increased work of breathing.  No history of previous peritonsillar abscess.  Medical History[1] Surgical History[2] Family History[3]   Allergies[4]   ROS A complete review of systems was conducted and was negative except as stated in the HPI.   Objective: Vitals:   01/26/24 1339  BP: 114/79  Pulse: 80  Temp: 98.2 F (36.8 C)   Physical Exam:   General Normocephalic, Awake, Alert and appropriate for the exam  Eyes PERRL, no scleral icterus or conjunctival hemorrhage.  EOMI.  Ears Right ear- Normal auricles and EACs. TM: intact, no effusion, no retraction, normal landmarks. Left ear- Normal auricles and EACs. TM: intact, no effusion, no retraction, normal landmarks  Nose No external nasal deformities. Septum straight. Nasal mucosa moist. No significant turbinate hypertrophy. No masses or polyps.  Oral Cavity/Pharynx Tonsils: 2+, slight asymmetry of the right tonsil.  No significant asymmetry of the soft palate.  Uvula midline.  Palate elevates symmetrically.  Mild pharyngeal and tonsillar erythema.  No exudate. Mucosa moist and clear. No masses, lesions, or ulceration noted on the lips, alveolar ridges, buccal mucosa, floor of mouth, tongue, soft/hard palate, tonsils, or posterior pharynx. Soft FOM. Mobile tongue.  Neck/Lymphatics Soft and supple without LAD. Thyroid normal to palpation without dominant nodule or irregularity. Palpation of the salivary  glands demonstrates no masses or irregularity.  Cardio-vascular No cyanosis, regular rate  Pulmonary No audible stridor, Breathing easily with no labor. No dysphonia.   Neuro Symmetric facial movement.   Tongue protrudes in midline.  Psychiatry Appropriate affect and mood for clinic visit.  Skin No scars or lesions on face or neck.       Assessment:  Sonia Holt presents with evidence of resolving right peritonsillar abscess.  No clear evidence of recurrence on exam.  Recommended interrogation of previous I&D site, however the patient would like to defer at this time.  Will treat with combination of Levaquin and Flagyl as well as a prednisone taper.  Plan:  As above.  Return to clinic as needed for persistent or worsening symptoms.  Sonia MICAEL Rily, MD Otolaryngology - Head & Neck Surgery ENT & Audiology - [facility]   Electronically signed by: Sonia Elsie Rily, MD 01/26/2024 1:57 PM       [1] Past Medical History: Diagnosis Date  . Arthritis   . Bradycardia 03/22/2018  . Dental crown present    UPPER LEFT  . Depression   . Fatty liver   . Gastroesophageal reflux disease without esophagitis 06/18/2016  . H/O bariatric surgery 03/22/2018  . History of Roux-en-Y gastric bypass 07/29/2017  . Hypercholesteremia 06/18/2016  . Hyperlipidemia   . Joint pain   . Low back pain   . Mixed hyperlipidemia 03/22/2018  . Morbid obesity with BMI of 50.0-59.9, adult (CMD) 06/26/2014  . Other fatigue 04/26/2018  . SSS (sick sinus syndrome)    (CMD) 04/26/2018   Added automatically from request for surgery [phone]  . Stress   . Transfusion history   .  Wears contact lenses   [2] Past Surgical History: Procedure Laterality Date  . BELT ABDOMINOPLASTY     Procedure: ABDOMINOPLASTY  . BRACHIOPLASTY     Procedure: BRACHIOPLASTY  . BREAST AUGMENTATION Bilateral    Procedure: AUGMENTATION MAMMAPLASTY  . CARDIAC CATHETERIZATION     Procedure: CARDIAC CATHETERIZATION  . DUODENAL SWITCH  N/A 07/12/2017   Procedure: DUODENAL SWITCH LAPAROSCOPIC;  Surgeon: Wilfred Enzo Guan, MD;  Location: [facility] MAIN OR;  Service: General;  Laterality: N/A;  . ESOPHAGOGASTRODUODENOSCOPY     Procedure: ESOPHAGOGASTRODUODENOSCOPY  . INSERT / REPLACE / REMOVE PACEMAKER     Procedure: INSERT / REPLACE / REMOVE PACEMAKER  . LAPAROSCOPIC CHOLECYSTECTOMY N/A 2020   Procedure: LAPAROSCOPIC CHOLECYSTECTOMY  . LAPAROSCOPIC HYSTERECTOMY    . PILONIDAL CYST DRAINAGE  2006   Procedure: PILONIDAL CYST DRAINAGE  [3] Family History Problem Relation Name Age of Onset  . Heart disease Father         MI x 5, earliest MI at 81  . Hypertension Father    . Diabetes Father    . Kidney failure Paternal Grandmother    . Heart disease Paternal Grandmother    . Diabetes Paternal Grandmother    . Diabetes Paternal Aunt    . Diabetes Paternal Grandfather    . Breast cancer Neg Hx    . Colon cancer Neg Hx    [4] Allergies Allergen Reactions  . Latex Anaphylaxis  . Penicillin G Anaphylaxis  . Adhesive Other (See Comments)     RED WELPS, AND  Peels skin off, paper tape does work best  . Celecoxib GI Intolerance

## 2024-01-26 NOTE — Telephone Encounter (Signed)
 LMOM for pt to come on to the office due to Dr. Elyse wanting to see her today.  I also messaged patient.

## 2024-02-17 NOTE — Progress Notes (Signed)
 [facility] Gynecologic Follow Up Visit    ASSESSMENT & PLAN:  Problem List Items Addressed This Visit   None Visit Diagnoses       Fatigue, unspecified type    -  Primary   Relevant Orders   CBC with Differential   Comprehensive Metabolic Panel   Vitamin D, 25-Hydroxy   Vitamin B12   TSH   T4, Free   Estradiol   Follicle-stimulating Hormone (FSH)   Anemia Profile     Hot flushes, perimenopausal       Relevant Orders   CBC with Differential   Comprehensive Metabolic Panel   Vitamin D, 25-Hydroxy   Vitamin B12   TSH   T4, Free   Estradiol   Follicle-stimulating Hormone (FSH)   Anemia Profile       No follow-ups on file.   CHIEF COMPLAINT: No chief complaint on file.    DISCUSSED: Sonia Holt is a 38 y.o. No obstetric history on file. presents today for a follow up of postoperative vaginal cuff and pelvic abscess.   Completed full course of antibiotics  Recent MRI demonstrating resolution of abscess, and no other issues related to surgery aside from ongoing fatigue and some hot flushing. .   180 total daily iron (malabsorption vitamins)  Fatigue since surgery  Mother menopause at 70  Active lifestyle  Well healed and well estrogenized vaginal canal and cuff.   Recent tonsillar abscess (3 I and Ds)  Discussed safe sex practices.  Estradiol and FSH ordered, anemia profile ordered All of Sonia Holt's questions were answered today.     PERTINENT OB/GYN HISTORY:  (findings are per pt, as reviewed by CMA at the beginning of today's visit;  findings personally confirmed by me)  Follow up postoperative pelvic abscess  HEALTH MAINTENANCE: (as per Epic Charting) Health Maintenance Status       Date Due Completion Dates   Varicella Vaccines (1 of 2 - 13+ 2-dose series) Never done ---   HIV Screening Never done ---   Hepatitis C Screening Never done ---   DTaP/Tdap/Td Vaccines (1 - Tdap) Never done ---   Hepatitis B Vaccines (1 of 3 - 19+  3-dose series) Never done ---   HPV Vaccines (1 - 3-dose SCDM series) Never done ---   COVID-19 Vaccine (1 - 2024-25 season) Never done ---   Influenza Vaccine (1) 03/10/2024 ---   Comprehensive Annual Visit 10/04/2024 10/05/2023, 02/17/2021   Diabetes Screening 11/19/2024 11/20/2023, 11/19/2023   Depression Screening 01/10/2025 01/11/2024   Adult RSV (60+ Years or Pregnancy) (1 - 1-dose 75+ series) 04/03/2060 ---       REVIEW OF SYSTEMS:  GENERAL: no new fevers, chills, or significant changes in weight  BREAST: no pain, masses or discharge RESPIRATORY: no new cough, channge in her work of breathing or new shortness of breath CARDIOVASCULAR: no new onset chest pain or palpitations GASTROINTESTINAL: no new N/V/D, regular bowel movements, no abdominal pain GENITOURINARY: no dysuria, no bowel or bladder incontinence, no vaginal discharge PSYCHIATRY: no recent changes in mood, noanxiety/depression.  Fatigue  OBJECTIVE: Vitals:   02/17/24 0808  BP: 108/72    Physical Exam-- GENERAL: well developed, well nourished, in no acute distress RESP: even and unlabored  ABDOMEN: Bowel sounds present, soft, non-distended, NTTP. PELVIC:  External genitalia normal without lesions. Urethra external normal Vaginal mucosa is pink and rugated  without lesions. Discharge is not present.  Cervix is absent Uterus absent Adnexa without masses or tenderness. No palpable abscess at  cuff or in adnexa Perineum and anus externally normal   A chaperone was present for exam. Limited by habitus  I have personally spent 30 minutes involved in face-to-face and non-face-to-face activities for this patient on the day of the visit.  Professional time spent includes the following activities, in addition to those noted in the documentation:  -reviewing previous notes, previous scan results, discussion of fatigue and other symptoms, physical exam in preparation for the patient's visit,  -reviewing medications and  allergies in preparation,  -in counseling and educating the patient and/or family/caregiver if present,  -in ordering medications, test, or procedures if necessary,  -and in documentation after patient has checked out from office.

## 2024-02-17 NOTE — Progress Notes (Signed)
 Post op

## 2024-02-21 NOTE — Progress Notes (Signed)
 Your vitamin D was low. I sent weekly vitamin D for 8 weeks, to be followed by daily vitamin D.   Your hormone labs showed that you are not in menopause, and blood counts were overall within normal limits.
